# Patient Record
Sex: Female | Born: 1985 | Race: Black or African American | Hispanic: No | Marital: Married
Health system: Southern US, Community
[De-identification: ages and names within clinical notes are randomized; demographics above are authoritative.]

## PROBLEM LIST (undated history)

## (undated) DIAGNOSIS — G809 Cerebral palsy, unspecified: Secondary | ICD-10-CM

---

## 2020-11-27 ENCOUNTER — Encounter (HOSPITAL_COMMUNITY): Payer: Self-pay | Admitting: Emergency Medicine

## 2020-11-27 ENCOUNTER — Emergency Department (HOSPITAL_COMMUNITY)
Admission: EM | Admit: 2020-11-27 | Discharge: 2020-11-27 | Disposition: A | Discharge status: Home / Self Care | Payer: Medicaid Other | Attending: Emergency Medicine | Admitting: Emergency Medicine

## 2020-11-27 ENCOUNTER — Other Ambulatory Visit: Payer: Self-pay

## 2020-11-27 ENCOUNTER — Emergency Department (HOSPITAL_COMMUNITY): Payer: Medicaid Other

## 2020-11-27 DIAGNOSIS — M545 Low back pain, unspecified: Secondary | ICD-10-CM | POA: Insufficient documentation

## 2020-11-27 DIAGNOSIS — S0990XA Unspecified injury of head, initial encounter: Secondary | ICD-10-CM | POA: Insufficient documentation

## 2020-11-27 DIAGNOSIS — W182XXA Fall in (into) shower or empty bathtub, initial encounter: Secondary | ICD-10-CM | POA: Diagnosis not present

## 2020-11-27 DIAGNOSIS — W19XXXA Unspecified fall, initial encounter: Secondary | ICD-10-CM

## 2020-11-27 HISTORY — DX: Cerebral palsy, unspecified: G80.9

## 2020-11-27 NOTE — Discharge Instructions (Signed)
Please return for any problem.  °

## 2020-11-27 NOTE — ED Provider Notes (Signed)
[facility] EMERGENCY DEPARTMENT Provider Note   CSN: [id number] Arrival date & time: 11/27/20  1742     History Chief Complaint  Patient presents with  . Fall    Heidi Fernandez is a 35 y.o. female.  35 year old female with prior medical history as detailed below presents for evaluation.  Patient reports fall yesterday in a bathtub.  She reports striking her head and possibly passing out.  She also complains of lower back pain.  This appears to be chronic.  She denies other specific injury related to the reported fall that occurred on 3/8.   The history is provided by the patient and medical records.  Fall This is a new problem. The current episode started yesterday. The problem occurs every several days. The problem has not changed since onset.Pertinent negatives include no chest pain, no abdominal pain, no headaches and no shortness of breath. Nothing aggravates the symptoms. Nothing relieves the symptoms.       Past Medical History:  Diagnosis Date  . Cerebral palsy (HCC)     There are no problems to display for this patient.     OB History   No obstetric history on file.     No family history on file.     Home Medications Prior to Admission medications   Not on File    Allergies    Patient has no known allergies.  Review of Systems   Review of Systems  Respiratory: Negative for shortness of breath.   Cardiovascular: Negative for chest pain.  Gastrointestinal: Negative for abdominal pain.  Neurological: Negative for headaches.  All other systems reviewed and are negative.   Physical Exam Updated Vital Signs BP (!) 102/42 (BP Location: Right Arm)   Pulse 89   Temp 98.7 F (37.1 C) (Oral)   Resp 16   SpO2 96%   Physical Exam Vitals and nursing note reviewed.  Constitutional:      General: She is not in acute distress.    Appearance: Normal appearance. She is well-developed and well-nourished.  HENT:     Head: Normocephalic  and atraumatic.     Mouth/Throat:     Mouth: Oropharynx is clear and moist.  Eyes:     Extraocular Movements: EOM normal.     Conjunctiva/sclera: Conjunctivae normal.     Pupils: Pupils are equal, round, and reactive to light.  Cardiovascular:     Rate and Rhythm: Normal rate and regular rhythm.     Heart sounds: Normal heart sounds.  Pulmonary:     Effort: Pulmonary effort is normal. No respiratory distress.     Breath sounds: Normal breath sounds.  Abdominal:     General: There is no distension.     Palpations: Abdomen is soft.     Tenderness: There is no abdominal tenderness.  Musculoskeletal:        General: No deformity or edema. Normal range of motion.     Cervical back: Normal range of motion and neck supple.  Skin:    General: Skin is warm and dry.  Neurological:     General: No focal deficit present.     Mental Status: She is alert and oriented to person, place, and time. Mental status is at baseline.  Psychiatric:        Mood and Affect: Mood and affect normal.     ED Results / Procedures / Treatments   Labs (all labs ordered are listed, but only abnormal results are displayed) Labs Reviewed -  No data to display  EKG None  Radiology CT Head Wo Contrast  Result Date: 11/27/2020 CLINICAL DATA:  Recent fall with headaches, initial encounter EXAM: CT HEAD WITHOUT CONTRAST TECHNIQUE: Contiguous axial images were obtained from the base of the skull through the vertex without intravenous contrast. COMPARISON:  None. FINDINGS: Brain: No evidence of acute infarction, hemorrhage, hydrocephalus, extra-axial collection or mass lesion/mass effect. The ventricles are prominent with respect to the sulcal size raising suspicious for normal pressure hydrocephalus. Vascular: No hyperdense vessel or unexpected calcification. Skull: Normal. Negative for fracture or focal lesion. Sinuses/Orbits: No acute finding. Other: None. IMPRESSION: No acute intracranial abnormality noted.  Ventricular prominence suggesting the possibility of normal pressure hydrocephalus. Electronically Signed   By: Alcide Clever M.D.   On: 11/27/2020 20:54    Procedures Procedures   Medications Ordered in ED Medications - No data to display  ED Course  I have reviewed the triage vital signs and the nursing notes.  Pertinent labs & imaging results that were available during my care of the patient were reviewed by me and considered in my medical decision making (see chart for details).    MDM Rules/Calculators/A&P                          MDM  Screen complete  Heidi Fernandez was evaluated in Emergency Department on 11/27/2020 for the symptoms described in the history of present illness. She was evaluated in the context of the global COVID-19 pandemic, which necessitated consideration that the patient might be at risk for infection with the SARS-CoV-2 virus that causes COVID-19. Institutional protocols and algorithms that pertain to the evaluation of patients at risk for COVID-19 are in a state of rapid change based on information released by regulatory bodies including the CDC and federal and state organizations. These policies and algorithms were followed during the patient's care in the ED.  Patient is presenting with complaint of acute fall yesterday with injury to the head.  Patient did not report to this provider her recent ED evaluation at [facility].  Patient with multiday stay at [facility] ED with multiple PT evaluations and apparent attempted SNF placement.  Patient refused SNF placement from [facility].  When patient and her partner were asked about the details of their visit to [facility] they could not provide a clear reason for leaving.  Patient and her partner are requesting repeat physical therapy evaluation.  Patient was able to ambulate into the ED without difficulty.  She is able to ambulate just prior to discharge without difficulty.   Patient does not  meet criteria for admission at this time.    CT imaging performed of her head does not reveal acute pathology.  Patient and her partner were advised to closely follow-up with regular care provider.  Outpatient ambulatory referral to PT made.   Final Clinical Impression(s) / ED Diagnoses Final diagnoses:  Fall, initial encounter    Rx / DC Orders ED Discharge Orders         Ordered    Ambulatory referral to Physical Therapy       Comments: Patient with cerebral palsy, chronic deconditioning, difficulty ambulating.  Recent prior PT eval at [facility] system.   11/27/20 2310           Wynetta Fines, MD 11/27/20 2330

## 2020-11-27 NOTE — ED Triage Notes (Signed)
Patient with history of cerebral palsy complains of pain after a fall today in the bathtub, reports multiple falls recently. Patient alert, oriented, and in no apparent distress at this time.

## 2021-09-18 IMAGING — CT CT HEAD W/O CM
4 series · 17 of 47 positions shown, 19 images · non-contrast
Comparison: None.

CLINICAL DATA: Recent fall with headaches, initial encounter

EXAM:
CT HEAD WITHOUT CONTRAST
TECHNIQUE: Contiguous axial images were obtained from the base of the skull
through the vertex without intravenous contrast.

[Series 3: head without · axial · non-contrast · 0.39mm/px · z∈[-705,-590]mm · 7 of 31 slices shown, 9 images]
[im 4/31  brain]
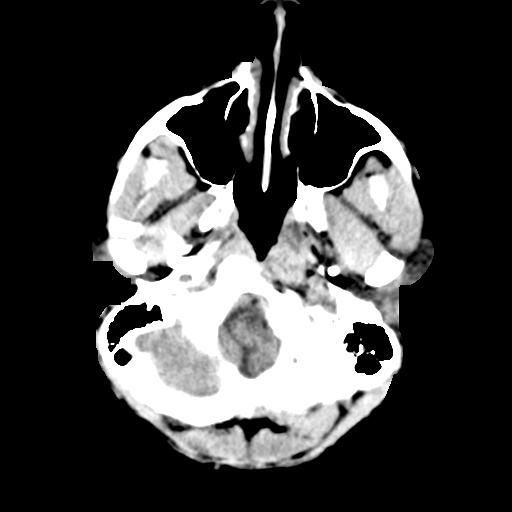
[im 4/31  bone]
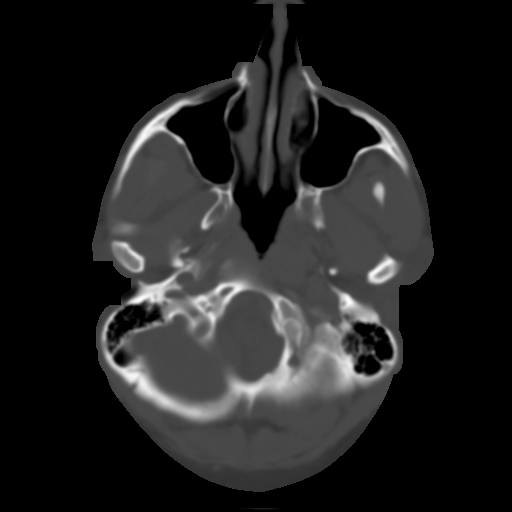
[im 8/31  brain]
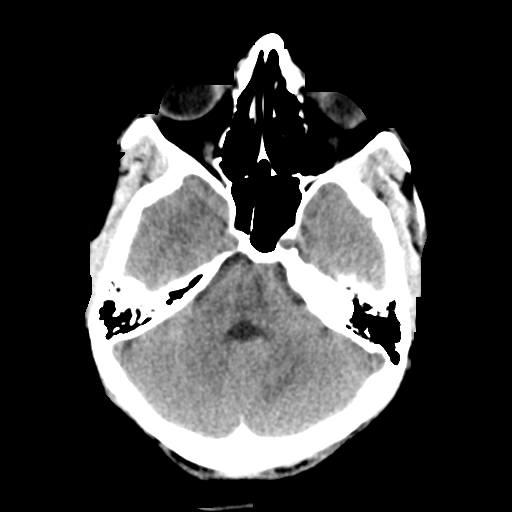
[im 12/31  brain]
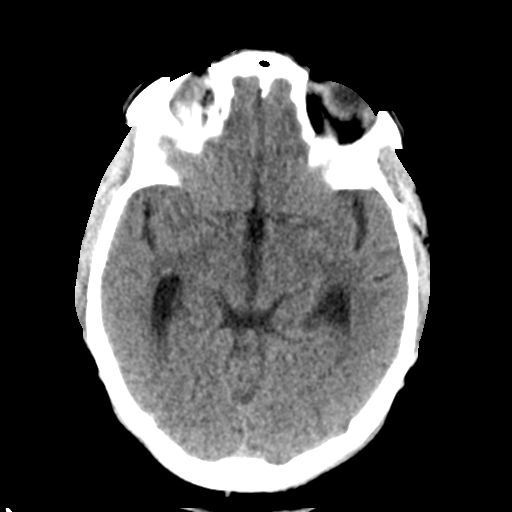
[im 16/31  brain]
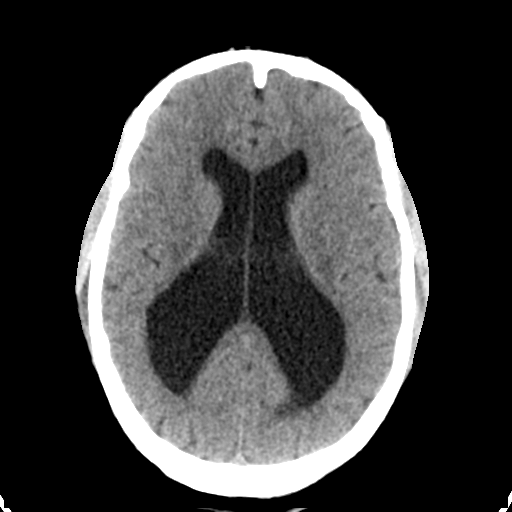
[im 19/31  brain]
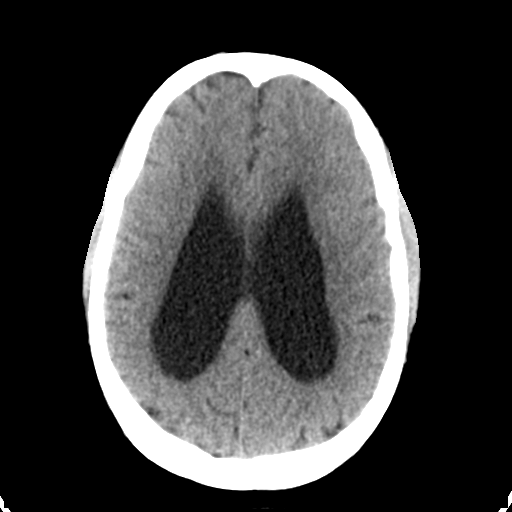
[im 19/31  bone]
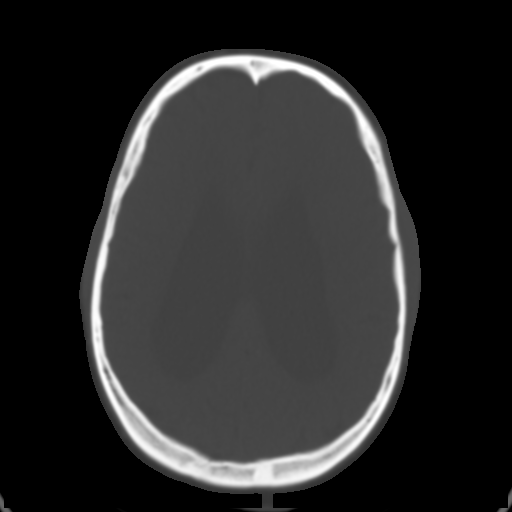
[im 23/31  brain]
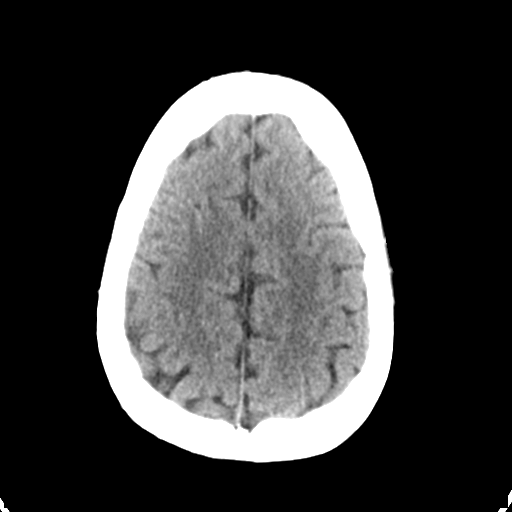
[im 27/31  brain]
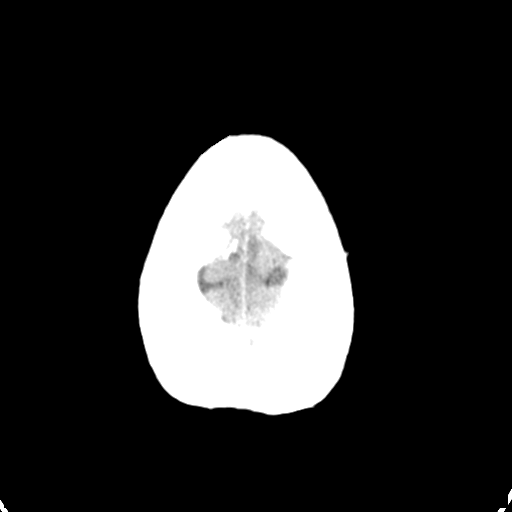

[Series 4: head bone · axial · 0.39mm/px · z∈[-706,-654]mm · 4 of 76 slices shown]
[im 8/76  bone]
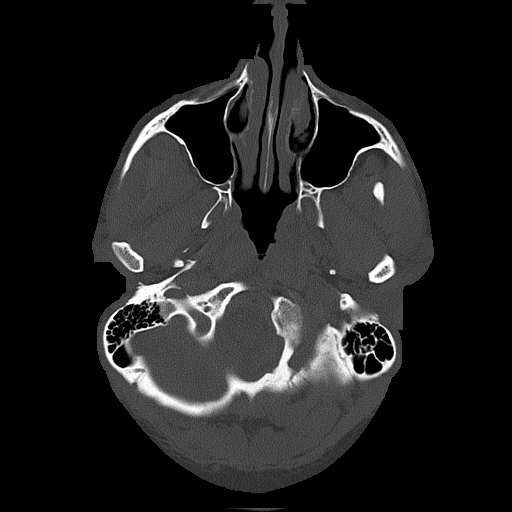
[im 16/76  bone]
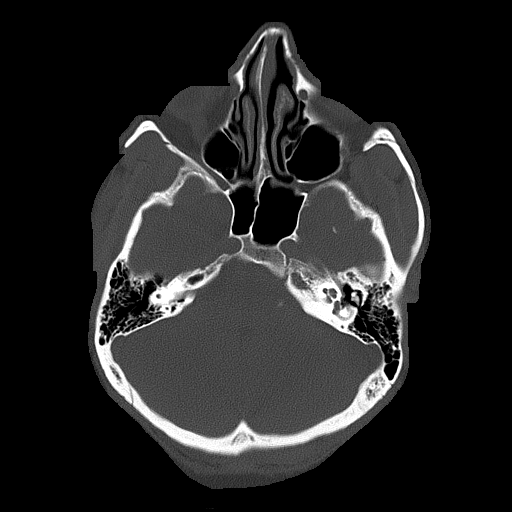
[im 23/76  bone]
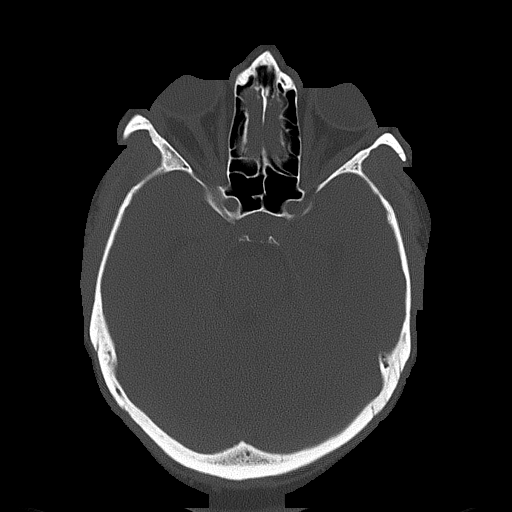
[im 34/76  bone]
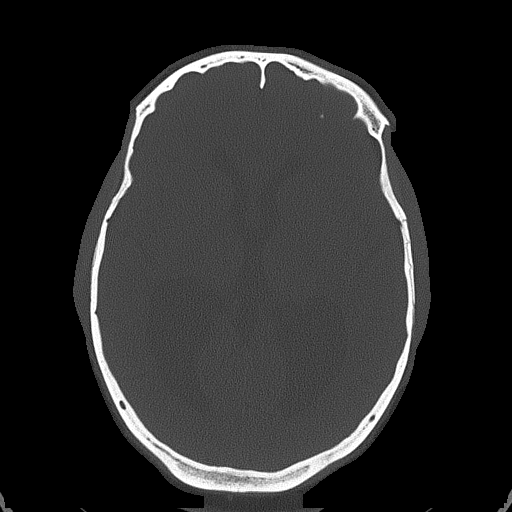

[Series 5: head without cor · coronal · non-contrast · 0.33mm/px · 3 of 69 slices shown]
[im 23/69  brain]
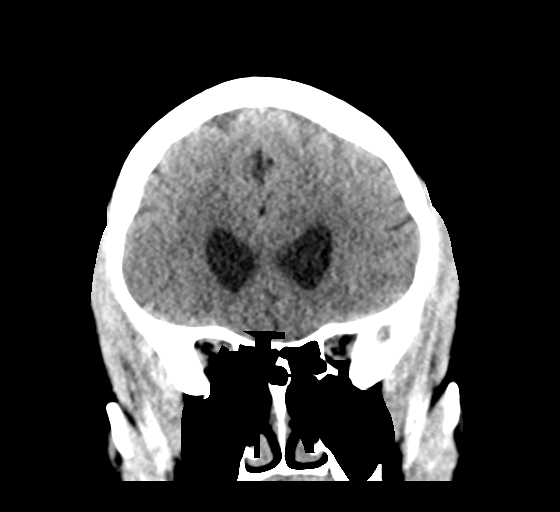
[im 31/69  brain]
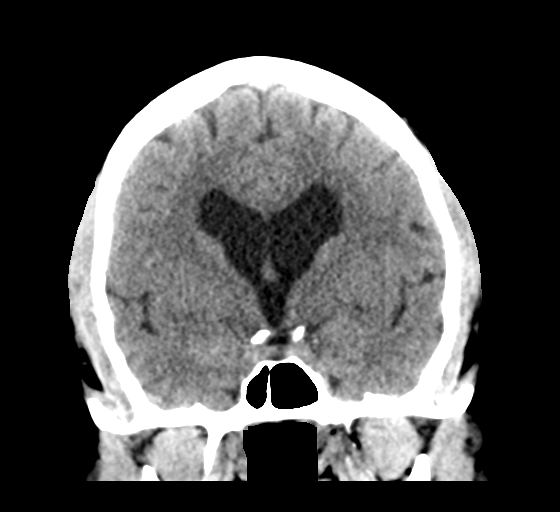
[im 38/69  brain]
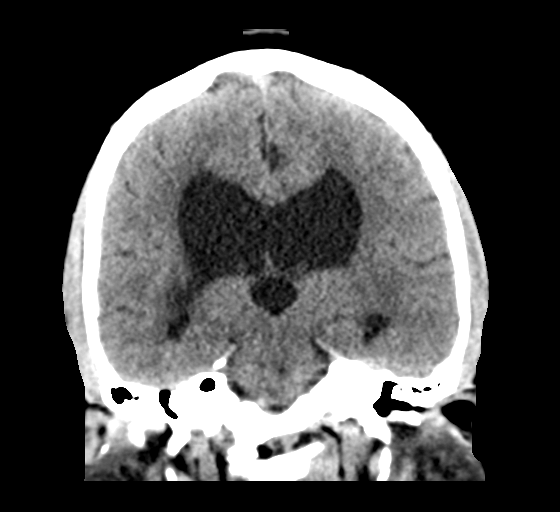

[Series 6: head without sag · sagittal · non-contrast · 0.34mm/px · 3 of 58 slices shown]
[im 20/58  brain]
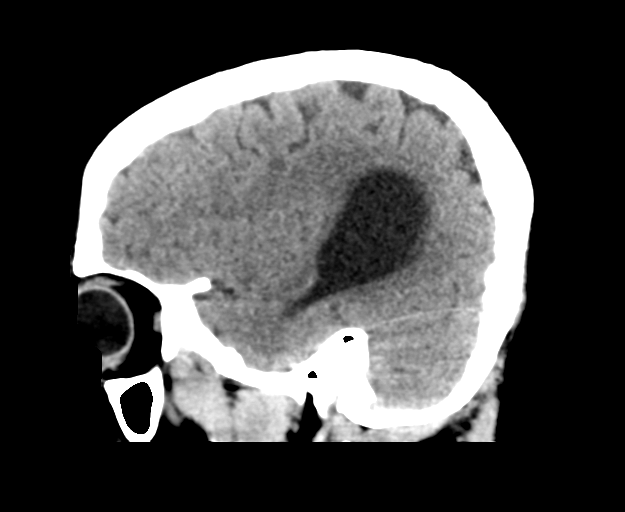
[im 29/58  brain]
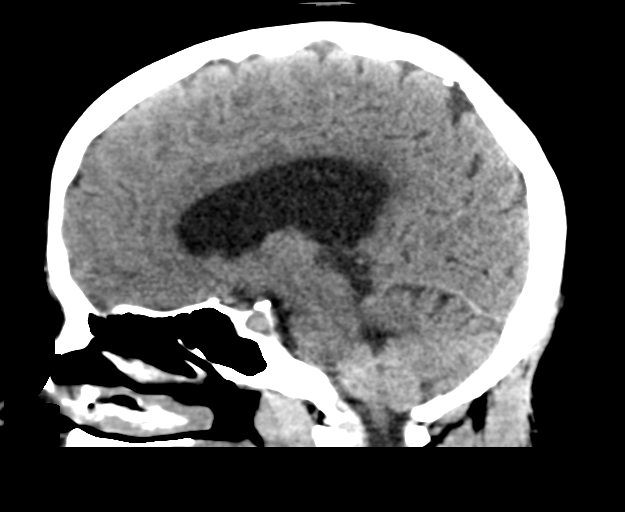
[im 39/58  brain]
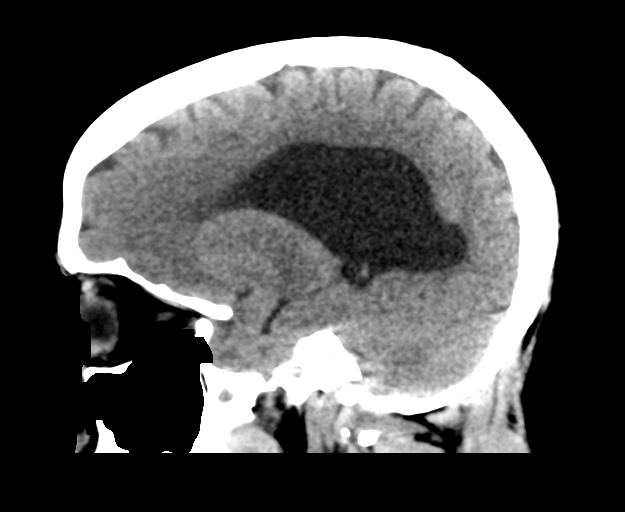

[17 of 47 positions shown; findings below may reference images not displayed]

FINDINGS: Brain: No evidence of acute infarction, hemorrhage, hydrocephalus,
extra-axial collection or mass lesion/mass effect. The ventricles
are prominent with respect to the sulcal size raising suspicious for
normal pressure hydrocephalus.

Vascular: No hyperdense vessel or unexpected calcification.

Skull: Normal. Negative for fracture or focal lesion.

Sinuses/Orbits: No acute finding.

Other: None.
IMPRESSION: No acute intracranial abnormality noted.

Ventricular prominence suggesting the possibility of normal pressure
hydrocephalus.
